# Patient Record
Sex: Female | Born: 1990 | Race: White | Hispanic: Yes | Marital: Single | Smoking: Never smoker
Health system: Southern US, Community
[De-identification: ages and names within clinical notes are randomized; demographics above are authoritative.]

---
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
## 2012-10-12 ENCOUNTER — Emergency Department (HOSPITAL_COMMUNITY): Payer: Self-pay

## 2012-10-12 ENCOUNTER — Encounter (HOSPITAL_COMMUNITY): Payer: Self-pay | Admitting: Emergency Medicine

## 2012-10-12 ENCOUNTER — Emergency Department (HOSPITAL_COMMUNITY)
Admission: EM | Admit: 2012-10-12 | Discharge: 2012-10-12 | Disposition: A | Discharge status: Home / Self Care | Payer: Self-pay | Attending: Emergency Medicine | Admitting: Emergency Medicine

## 2012-10-12 DIAGNOSIS — Z87828 Personal history of other (healed) physical injury and trauma: Secondary | ICD-10-CM | POA: Insufficient documentation

## 2012-10-12 DIAGNOSIS — F1010 Alcohol abuse, uncomplicated: Secondary | ICD-10-CM | POA: Insufficient documentation

## 2012-10-12 DIAGNOSIS — R51 Headache: Secondary | ICD-10-CM | POA: Insufficient documentation

## 2012-10-12 DIAGNOSIS — F329 Major depressive disorder, single episode, unspecified: Secondary | ICD-10-CM | POA: Insufficient documentation

## 2012-10-12 DIAGNOSIS — Z3202 Encounter for pregnancy test, result negative: Secondary | ICD-10-CM | POA: Insufficient documentation

## 2012-10-12 DIAGNOSIS — F17200 Nicotine dependence, unspecified, uncomplicated: Secondary | ICD-10-CM | POA: Insufficient documentation

## 2012-10-12 DIAGNOSIS — F3289 Other specified depressive episodes: Secondary | ICD-10-CM | POA: Insufficient documentation

## 2012-10-12 LAB — COMPREHENSIVE METABOLIC PANEL
ALT: 10 U/L (ref 0–35)
AST: 17 U/L (ref 0–37)
CO2: 29 mEq/L (ref 19–32)
Calcium: 9 mg/dL (ref 8.4–10.5)
GFR calc non Af Amer: >90 mL/min (ref >90)
Sodium: 143 mEq/L (ref 135–145)

## 2012-10-12 LAB — CBC
MCH: 31.9 pg (ref 26.0–34.0)
Platelets: 250 10*3/uL (ref 150–400)
RBC: 4.92 MIL/uL (ref 3.87–5.11)
WBC: 7.5 10*3/uL (ref 4.0–10.5)

## 2012-10-12 LAB — RAPID URINE DRUG SCREEN, HOSP PERFORMED
Amphetamines: NOT DETECTED
Cocaine: NOT DETECTED
Opiates: NOT DETECTED
Tetrahydrocannabinol: POSITIVE — AB

## 2012-10-12 LAB — ACETAMINOPHEN LEVEL: Acetaminophen (Tylenol), Serum: <15 ug/mL (ref 10–30)

## 2012-10-12 MED ORDER — FOLIC ACID 1 MG PO TABS: 1.0000 mg | ORAL_TABLET | Freq: Every day | ORAL | Status: DC

## 2012-10-12 MED ORDER — LORAZEPAM 1 MG PO TABS: 0.0000 mg | ORAL_TABLET | Freq: Four times a day (QID) | ORAL | Status: DC

## 2012-10-12 MED ORDER — VITAMIN B-1 100 MG PO TABS: 100.0000 mg | ORAL_TABLET | Freq: Every day | ORAL | Status: DC

## 2012-10-12 MED ORDER — LORAZEPAM 1 MG PO TABS: 1.0000 mg | ORAL_TABLET | Freq: Three times a day (TID) | ORAL | Status: DC | PRN

## 2012-10-12 MED ORDER — LORAZEPAM 1 MG PO TABS: 0.0000 mg | ORAL_TABLET | Freq: Two times a day (BID) | ORAL | Status: DC

## 2012-10-12 MED ORDER — IBUPROFEN 600 MG PO TABS: 600.0000 mg | ORAL_TABLET | Freq: Three times a day (TID) | ORAL | Status: DC | PRN

## 2012-10-12 MED ORDER — ZOLPIDEM TARTRATE 5 MG PO TABS: 5.0000 mg | ORAL_TABLET | Freq: Every evening | ORAL | Status: DC | PRN

## 2012-10-12 MED ORDER — LORAZEPAM 2 MG/ML IJ SOLN: 1.0000 mg | Freq: Four times a day (QID) | INTRAMUSCULAR | Status: DC | PRN

## 2012-10-12 MED ORDER — ALUM & MAG HYDROXIDE-SIMETH 200-200-20 MG/5ML PO SUSP: 30.0000 mL | ORAL | Status: DC | PRN

## 2012-10-12 MED ORDER — NICOTINE 21 MG/24HR TD PT24: 21.0000 mg | MEDICATED_PATCH | Freq: Every day | TRANSDERMAL | Status: DC

## 2012-10-12 MED ORDER — ONDANSETRON HCL 4 MG PO TABS: 4.0000 mg | ORAL_TABLET | Freq: Three times a day (TID) | ORAL | Status: DC | PRN

## 2012-10-12 MED ORDER — ACETAMINOPHEN 325 MG PO TABS: 650.0000 mg | ORAL_TABLET | ORAL | Status: DC | PRN

## 2012-10-12 MED ORDER — LORAZEPAM 1 MG PO TABS: 1.0000 mg | ORAL_TABLET | Freq: Four times a day (QID) | ORAL | Status: DC | PRN

## 2012-10-12 MED ORDER — ADULT MULTIVITAMIN W/MINERALS CH: 1.0000 | ORAL_TABLET | Freq: Every day | ORAL | Status: DC

## 2012-10-12 MED ORDER — THIAMINE HCL 100 MG/ML IJ SOLN: 100.0000 mg | Freq: Every day | INTRAMUSCULAR | Status: DC

## 2012-10-12 NOTE — ED Notes (Signed)
Patient states she has been drinking alcohol (approximately a fifth a day) x 4 months.  Patient also uses marijuana but denies any other drug use.  Patient states she SI without a plan.  Patient states she wants detox.
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
## 2012-10-12 NOTE — BH Assessment (Signed)
Assessment Note   Holly Myers is an 22 y.o. female. Patient states she has been drinking alcohol (approximately a fifth a day or "6-8 shots) x 4 months daily. Patient last drank alcohol this morning as she was partying with friends.  Patient also uses marijuana 1x per weekend but denies any other drug use. She last smoked marijuana 10/10/12. Per ED notes, patient wants detox. However, during the [facility] assessment patient sts that she does not want detox and would like to go home asap. Patient does not appear motivated for any type of treatment suggested, not even outpatient. Sts she has never received substance abuse treatment in the past. Patient denies active withdrawal symptoms at this time. No history of seizures or blackouts.  Per ED notes, patient states she is SI without a plan. During the [facility] assessment patient denies SI stating, "I don't remember saying that I must have been drunk". Patient adamantly denies SI at this time. Also, contracts for safety today. She admits to 1 prior suicide attempt as a child stating she was trying to deal with being gay. Patient does admit to current depression. She denies any triggers for her depression or current stressors. Says that she sometimes isolates or self from others and feels guilty. Patient does not feel that she has a lot of support at home (lives with both parents).  No HI or AVH's.   Patient has no history of mental health hospitalizations or outpatient therapy.    Axis I: Substance Induced Mood Disorder, Alcohol Dependence Axis II: Deferred Axis III: History reviewed. No pertinent past medical history. Axis IV: other psychosocial or environmental problems, problems related to social environment, problems with access to health care services and problems with primary support group Axis V: 31-40 impairment in reality testing  Past Medical History: History reviewed. No pertinent past medical history.  History reviewed. No pertinent past surgical  history.  Family History: History reviewed. No pertinent family history.  Social History:  reports that she has been smoking Cigarettes.  She has been smoking about 2.00 packs per day. She does not have any smokeless tobacco history on file. She reports that  drinks alcohol. She reports that she uses illicit drugs (Marijuana).  Additional Social History:  Alcohol / Drug Use Pain Medications: SEE MAR Prescriptions: SEE MAR Over the Counter: SEE MAR History of alcohol / drug use?: Yes Longest period of sobriety (when/how long): "a few days" Substance #1 Name of Substance 1: Alcohol  1 - Age of First Use: 22 yrs old  1 - Amount (size/oz): 1 pint or "several shots 7 or 8" 1 - Frequency: daily for the past 4 months  1 - Duration: 4 months  1 - Last Use / Amount: 10/11/12 Substance #2 Name of Substance 2: THC 2 - Age of First Use: 22 yrs old  2 - Amount (size/oz): varies 2 - Frequency: 1x per weekend 2 - Duration: since age 10  2 - Last Use / Amount: 10/10/12  CIWA: CIWA-Ar BP: 121/86 mmHg Pulse Rate: 85 Nausea and Vomiting: no nausea and no vomiting Tactile Disturbances: none Tremor: no tremor Auditory Disturbances: not present Paroxysmal Sweats: no sweat visible Visual Disturbances: not present Anxiety: no anxiety, at ease Headache, Fullness in Head: none present Agitation: normal activity Orientation and Clouding of Sensorium: oriented and can do serial additions CIWA-Ar Total: 0 COWS:    Allergies:  Allergies  Allergen Reactions  . Pollen Extract Shortness Of Breath    Home Medications:  (Not in  a hospital admission)  OB/GYN Status:  Patient's last menstrual period was 09/14/2012.  General Assessment Data Location of Assessment: [facility] ED Living Arrangements: Parent Can pt return to current living arrangement?: Yes Admission Status: Voluntary Is patient capable of signing voluntary admission?: Yes Transfer from: Acute Hospital Referral Source:  Self/Family/Friend     Risk to self Suicidal Ideation: Yes-Currently Present (per ED staff patient made suicidal comment; pt denies) Suicidal Intent: No Is patient at risk for suicide?: No Suicidal Plan?: No Access to Means: No What has been your use of drugs/alcohol within the last 12 months?:  (patient reports daily alcohol use and weekend THC use) Previous Attempts/Gestures: Yes How many times?:  (1x) Other Self Harm Risks:  (n/a) Triggers for Past Attempts: Other (Comment) ("Trying to deal with being gay") Intentional Self Injurious Behavior: None Family Suicide History: No Recent stressful life event(s): Other (Comment) (patient denies any stressors at this time) Persecutory voices/beliefs?: No Depression: No Substance abuse history and/or treatment for substance abuse?: No Suicide prevention information given to non-admitted patients: Not applicable  Risk to Others Homicidal Ideation: No Thoughts of Harm to Others: No Current Homicidal Intent: No Current Homicidal Plan: No Access to Homicidal Means: No Identified Victim:  (n/a) History of harm to others?: No Assessment of Violence: None Noted Violent Behavior Description:  (patient calm and cooperative) Does patient have access to weapons?: No Criminal Charges Pending?: No Does patient have a court date: No  Psychosis Hallucinations: None noted Delusions: None noted  Mental Status Report Appear/Hygiene: Disheveled Eye Contact: Good Motor Activity: Freedom of movement Speech: Logical/coherent Level of Consciousness: Alert Mood: Depressed Affect: Appropriate to circumstance Anxiety Level: None Thought Processes: Coherent Judgement: Unimpaired Orientation: Person;Place;Situation;Time Obsessive Compulsive Thoughts/Behaviors: None  Cognitive Functioning Concentration: Decreased Memory: Recent Intact;Remote Intact IQ: Average Insight: Fair Impulse Control: Fair Appetite: Good Weight Loss:  (none  reported) Weight Gain:  (none reported) Sleep: Decreased Total Hours of Sleep:  (n/a) Vegetative Symptoms: None  ADLScreening [facility] Assessment Services) Patient's cognitive ability adequate to safely complete daily activities?: Yes Patient able to express need for assistance with ADLs?: Yes Independently performs ADLs?: Yes (appropriate for developmental age)  Abuse/Neglect [facility]) Physical Abuse: Denies Verbal Abuse: Denies Sexual Abuse: Denies  Prior Inpatient Therapy Prior Inpatient Therapy: No Prior Therapy Dates:  (n/a) Prior Therapy Facilty/Provider(s):  (n/a) Reason for Treatment:  (n/a)  Prior Outpatient Therapy Prior Outpatient Therapy: No Prior Therapy Dates:  (n/a) Prior Therapy Facilty/Provider(s):  (n/a) Reason for Treatment:  (n/a)  ADL Screening (condition at time of admission) Patient's cognitive ability adequate to safely complete daily activities?: Yes Patient able to express need for assistance with ADLs?: Yes Independently performs ADLs?: Yes (appropriate for developmental age) Weakness of Legs: None Weakness of Arms/Hands: None  Home Assistive Devices/Equipment Home Assistive Devices/Equipment: None    Abuse/Neglect Assessment (Assessment to be complete while patient is alone) Physical Abuse: Denies Verbal Abuse: Denies Sexual Abuse: Denies Exploitation of patient/patient's resources: Denies Self-Neglect: Denies Values / Beliefs Cultural Requests During Hospitalization: None Spiritual Requests During Hospitalization: None     Nutrition Screen- [facility] Patient's home diet: Regular  Additional Information 1:1 In Past 12 Months?: No CIRT Risk: No Elopement Risk: No Does patient have medical clearance?: Yes     Disposition:  Disposition Initial Assessment Completed for this Encounter: Yes Disposition of Patient: Inpatient treatment program Type of inpatient treatment program: Adult  On Site Evaluation by:   Reviewed with  Physician:     Octaviano Batty 10/12/2012  1:07 PM
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
## 2012-10-12 NOTE — BHH Counselor (Signed)
Discharge home per telepsych. Writer met with patient to discuss follow up options. Patient expressed again that she is not interested in treatment at this time and sts she can quit on her own. She agreed to take the list of referrals provided and also agreed to follow up when she is ready.

## 2014-08-03 IMAGING — CT CT HEAD W/O CM
2 series · 16 of 30 positions shown, 20 images · non-contrast
Comparison: None.

CLINICAL DATA: Acute onset left sided headache

CT HEAD WITHOUT CONTRAST
TECHNIQUE: Contiguous axial images were obtained from the base of
the skull through the vertex without contrast.

[Series 2: head w/o · axial · non-contrast · 0.43mm/px · z∈[-140,-20]mm · 13 of 29 slices shown, 17 images]
[im 3/29  brain]
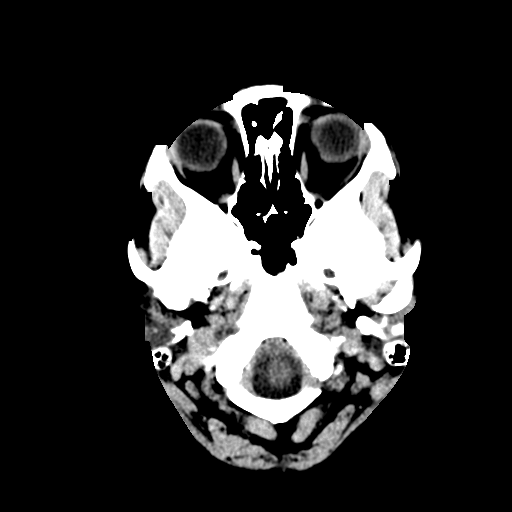
[im 3/29  bone]
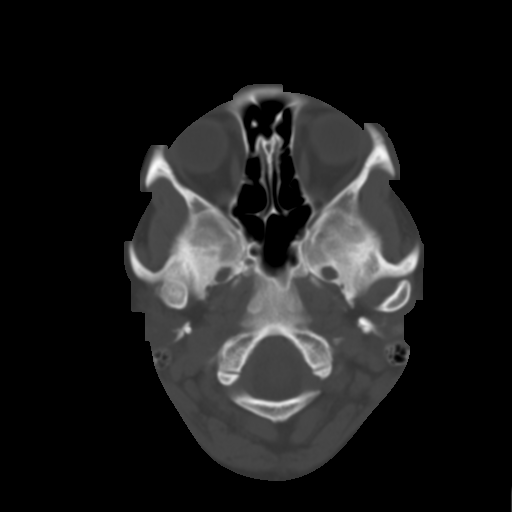
[im 5/29  brain]
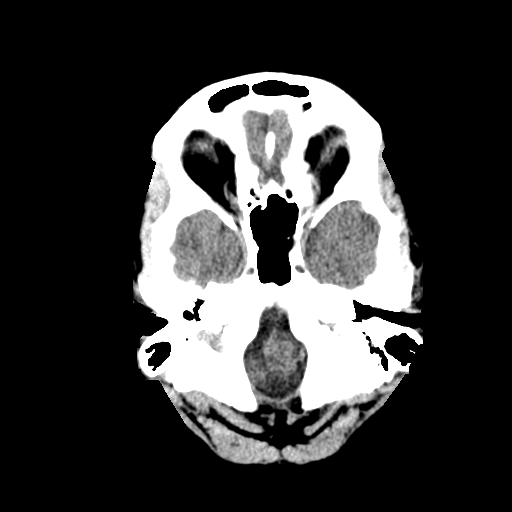
[im 7/29  brain]
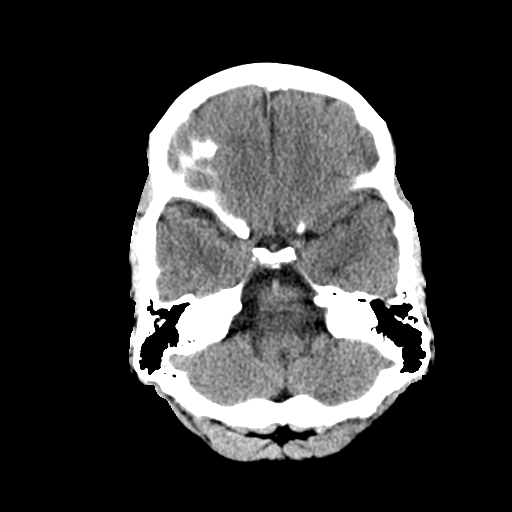
[im 9/29  brain]
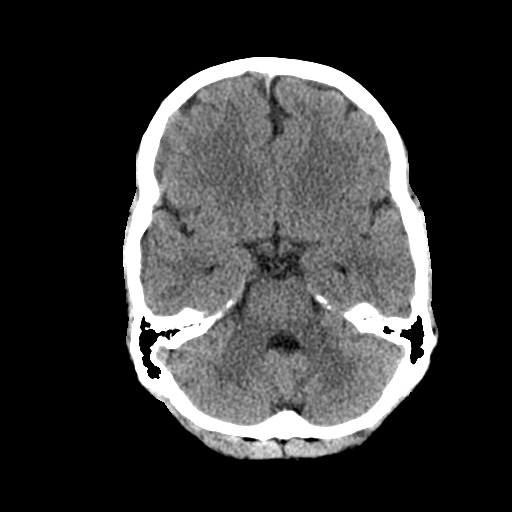
[im 11/29  brain]
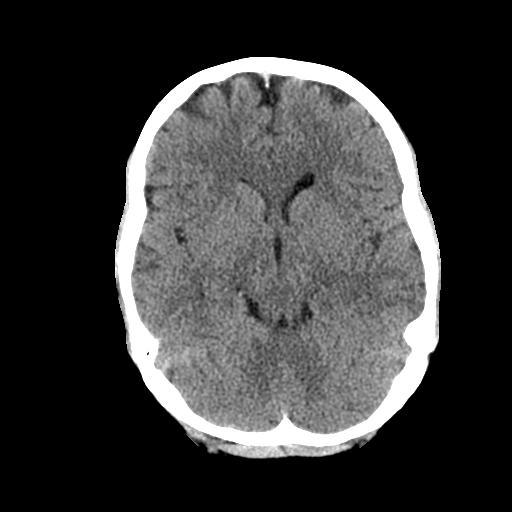
[im 11/29  bone]
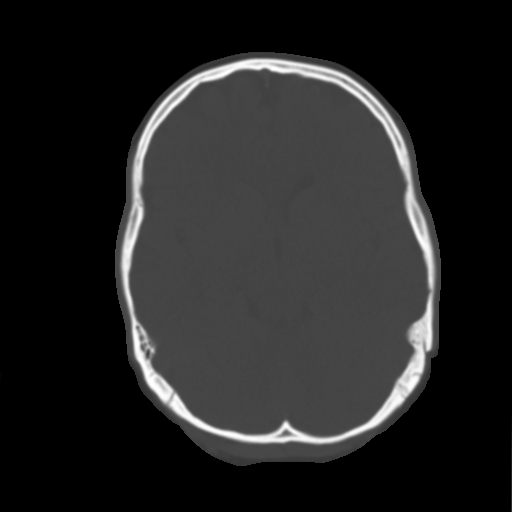
[im 13/29  brain]
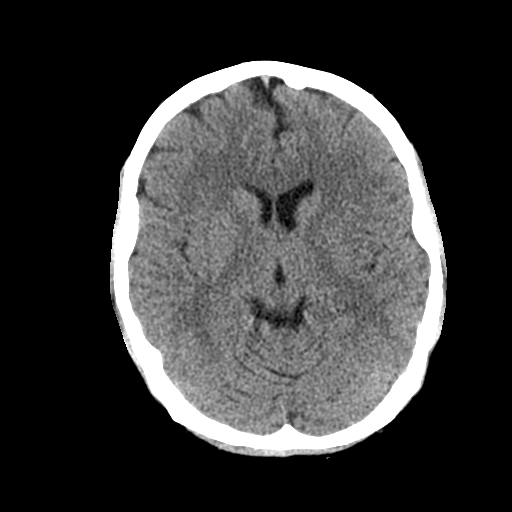
[im 15/29  brain]
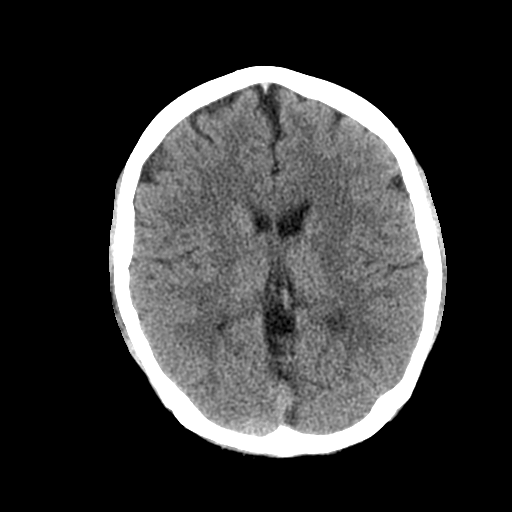
[im 17/29  brain]
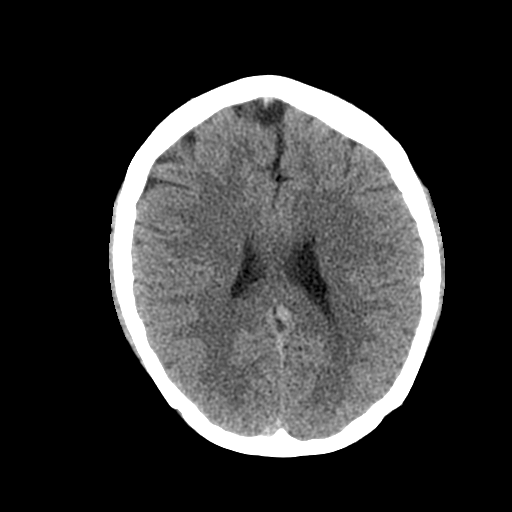
[im 19/29  brain]
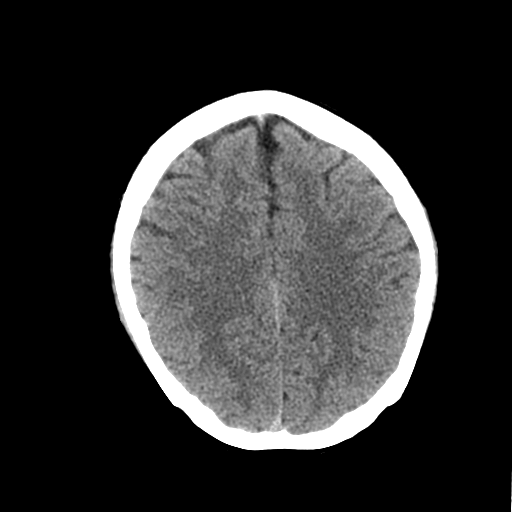
[im 19/29  bone]
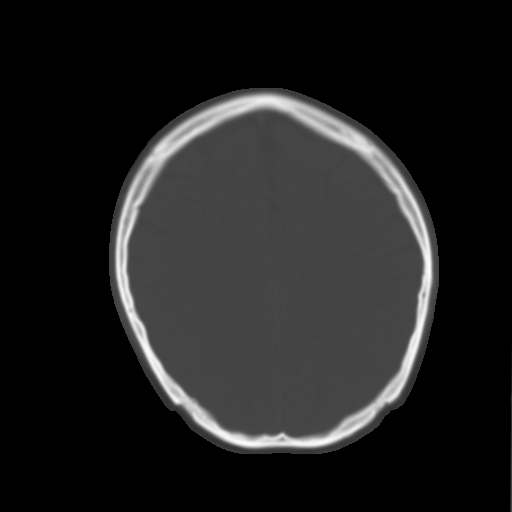
[im 21/29  brain]
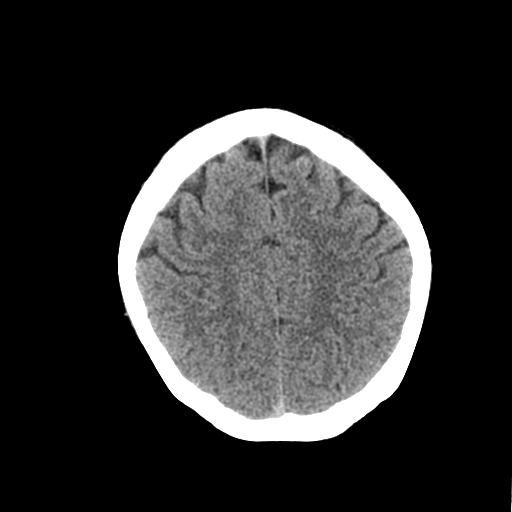
[im 23/29  brain]
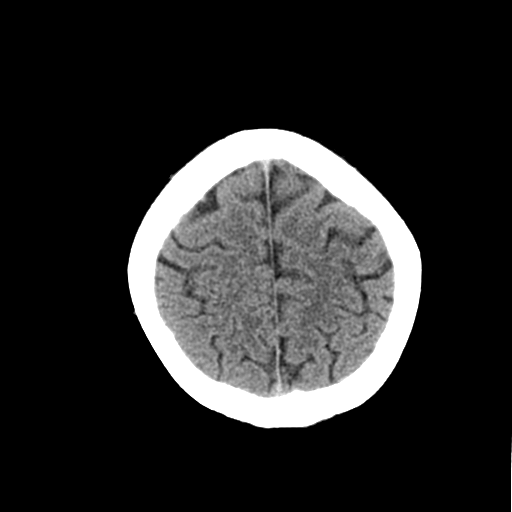
[im 25/29  brain]
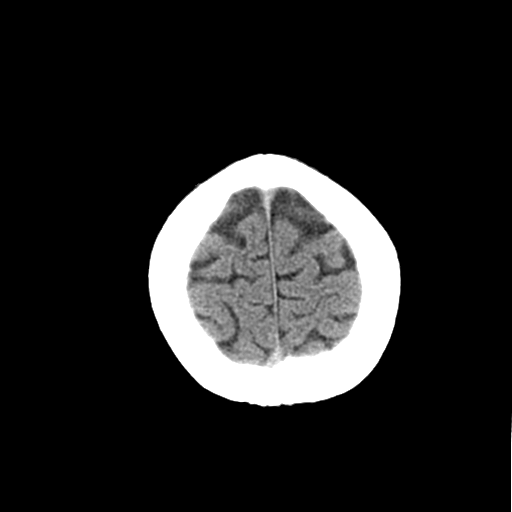
[im 27/29  brain]
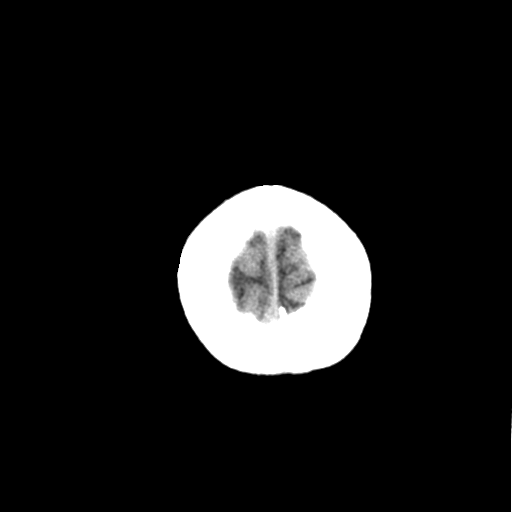
[im 27/29  bone]
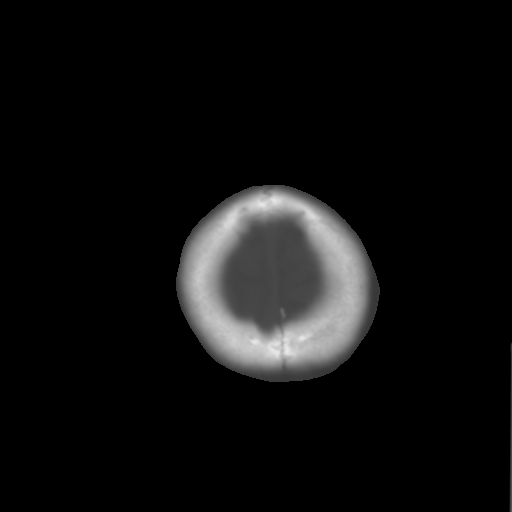

[Series 3: bone windows · axial · 0.43mm/px · z∈[-140,-100]mm · 3 of 29 slices shown]
[im 3/29  bone]
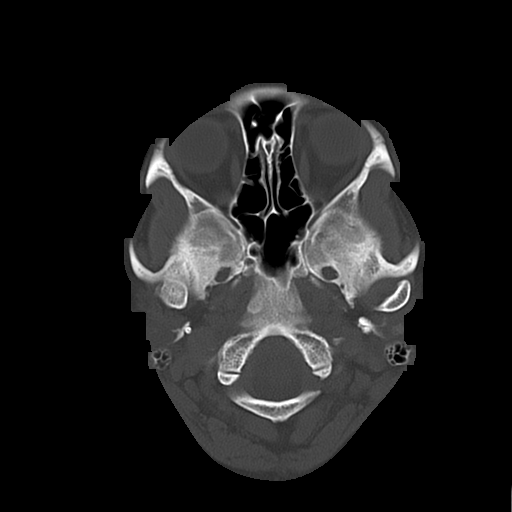
[im 7/29  bone]
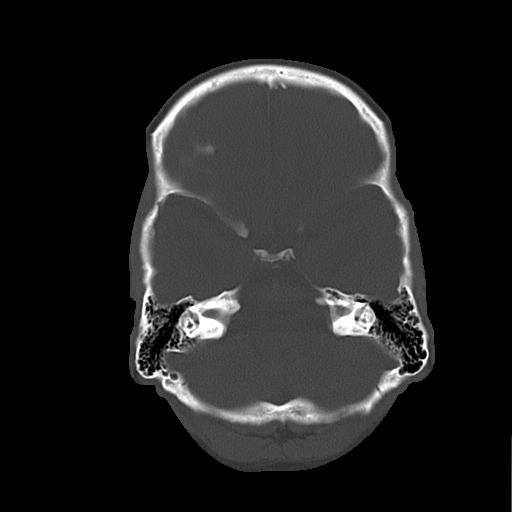
[im 11/29  bone]
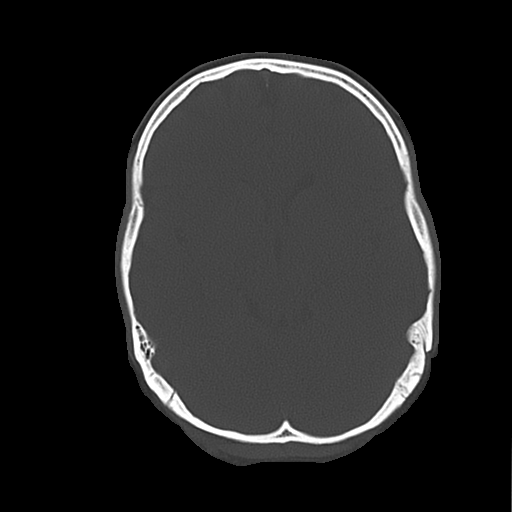

[16 of 30 positions shown; findings below may reference images not displayed]

FINDINGS: Ventricles are normal in size and configuration.  Left
lateral ventricle is slightly larger than right lateral ventricle,
an anatomic variant.  There is no mass, hemorrhage, extra-axial
fluid collection, or midline shift.  Gray-white compartments are
normal.  There is no apparent acute infarct.

The bony calvarium appears intact.  The mastoid air cells are
clear.
IMPRESSION: Study within normal limits.

## 2014-12-11 ENCOUNTER — Emergency Department (HOSPITAL_BASED_OUTPATIENT_CLINIC_OR_DEPARTMENT_OTHER)
Admission: EM | Admit: 2014-12-11 | Discharge: 2014-12-11 | Disposition: A | Discharge status: Home / Self Care | Payer: Self-pay | Attending: Emergency Medicine | Admitting: Emergency Medicine

## 2014-12-11 ENCOUNTER — Encounter (HOSPITAL_BASED_OUTPATIENT_CLINIC_OR_DEPARTMENT_OTHER): Payer: Self-pay

## 2014-12-11 ENCOUNTER — Emergency Department (HOSPITAL_BASED_OUTPATIENT_CLINIC_OR_DEPARTMENT_OTHER): Payer: Self-pay

## 2014-12-11 DIAGNOSIS — Y9389 Activity, other specified: Secondary | ICD-10-CM | POA: Insufficient documentation

## 2014-12-11 DIAGNOSIS — Y92410 Unspecified street and highway as the place of occurrence of the external cause: Secondary | ICD-10-CM | POA: Insufficient documentation

## 2014-12-11 DIAGNOSIS — Z3202 Encounter for pregnancy test, result negative: Secondary | ICD-10-CM | POA: Insufficient documentation

## 2014-12-11 DIAGNOSIS — Y998 Other external cause status: Secondary | ICD-10-CM | POA: Insufficient documentation

## 2014-12-11 DIAGNOSIS — R079 Chest pain, unspecified: Secondary | ICD-10-CM

## 2014-12-11 DIAGNOSIS — S20219A Contusion of unspecified front wall of thorax, initial encounter: Secondary | ICD-10-CM | POA: Insufficient documentation

## 2014-12-11 LAB — PREGNANCY, URINE: Preg Test, Ur: NEGATIVE

## 2014-12-11 MED ORDER — ACETAMINOPHEN 500 MG PO TABS
1000.0000 mg | ORAL_TABLET | Freq: Once | ORAL | Status: AC
  Administered 2014-12-11: 1000 mg via ORAL
  Filled 2014-12-11: qty 2

## 2014-12-11 MED ORDER — HYDROCODONE-ACETAMINOPHEN 5-325 MG PO TABS: ORAL_TABLET | ORAL | Status: AC
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
## 2014-12-11 NOTE — Discharge Instructions (Signed)
Take vicodin for breakthrough pain, do not drink alcohol, drive, care for children or do other critical tasks while taking vicodin.  It is very important that you take deep breaths to prevent lung collapse and infection.  Take 10 deep breaths every hour to prevent lung collapse.  If you develop cough, fever or shortness of breath return immediately to the emergency room.  Do not hesitate to return to the emergency room for any new, worsening or concerning symptoms.  Please obtain primary care using resource guide below. Let them know that you were seen in the emergency room and that they will need to obtain records for further outpatient management.   Chest Wall Pain Chest wall pain is pain felt in or around the chest bones and muscles. It may take up to 6 weeks to get better. It may take longer if you are active. Chest wall pain can happen on its own. Other times, things like germs, injury, coughing, or exercise can cause the pain. HOME CARE   Avoid activities that make you tired or cause pain. Try not to use your chest, belly (abdominal), or side muscles. Do not use heavy weights.  Put ice on the sore area.  Put ice in a plastic bag.  Place a towel between your skin and the bag.  Leave the ice on for 15-20 minutes for the first 2 days.  Only take medicine as told by your doctor. GET HELP RIGHT AWAY IF:   You have more pain or are very uncomfortable.  You have a fever.  Your chest pain gets worse.  You have new problems.  You feel sick to your stomach (nauseous) or throw up (vomit).  You start to sweat or feel lightheaded.  You have a cough with mucus (phlegm).  You cough up blood. MAKE SURE YOU:   Understand these instructions.  Will watch your condition.  Will get help right away if you are not doing well or get worse. Document Released: 11/20/2007 Document Revised: 08/26/2011 Document Reviewed: 01/28/2011 [facility] Patient Information 2015 [place], [place]. This  information is not intended to replace advice given to you by your health care provider. Make sure you discuss any questions you have with your health care provider.   Emergency Department Resource Guide 1) Find a Doctor and Pay Out of Pocket Although you won't have to find out who is covered by your insurance plan, it is a good idea to ask around and get recommendations. You will then need to call the office and see if the doctor you have chosen will accept you as a new patient and what types of options they offer for patients who are self-pay. Some doctors offer discounts or will set up payment plans for their patients who do not have insurance, but you will need to ask so you aren't surprised when you get to your appointment.  2) Contact Your Local Health Department Not all health departments have doctors that can see patients for sick visits, but many do, so it is worth a call to see if yours does. If you don't know where your local health department is, you can check in your phone book. The CDC also has a tool to help you locate your state's health department, and many state websites also have listings of all of their local health departments.  3) Find a Walk-in Clinic If your illness is not likely to be very severe or complicated, you may want to try a walk in clinic. These are  popping up all over the country in pharmacies, drugstores, and shopping centers. They're usually staffed by nurse practitioners or physician assistants that have been trained to treat common illnesses and complaints. They're usually fairly quick and inexpensive. However, if you have serious medical issues or chronic medical problems, these are probably not your best option.  No Primary Care Doctor: - Call Health Connect at  [phone] - they can help you locate a primary care doctor that  accepts your insurance, provides certain services, etc. - Physician Referral Service- [phone]  Chronic Pain  Problems: Organization         Address  Phone   Notes  [facility]  [phone] Patients need to be referred by their primary care doctor.   Medication Assistance: Organization         Address  Phone   Notes  [facility] Medication [facility] [address] [place] [place], [place] [address] [phone] --Must be a resident of [facility] -- Must have NO insurance coverage whatsoever (no Medicaid/ Medicare, etc.) -- The pt. MUST have a primary care doctor that directs their care regularly and follows them in the community   MedAssist  [phone]   [employer]  [phone]    Agencies that provide inexpensive medical care: Organization         Address  Phone   Notes  [facility]  [phone]   [facility]    [phone]   [facility] [address] [place], [place] [address] [phone]   [facility] [address] [place] [place] [phone]   Planned Parenthood    [phone]   [facility]    [phone]   [facility] and [facility]  [address], [place] Phone:  [phone], Fax:  [phone] Hours of Operation:  9 am - 6 pm, M-F.  Also accepts Medicaid/Medicare and self-pay.  [facility]  [address], [place] Phone: [phone], Fax: [phone]. Hours of Operation:  8:30 am - 5:30 pm, M-F.  Also accepts Medicaid and self-pay.  [facility] [place] [address] [place] Phone: [phone]   [facility] [address] Natasha Bence [place], [place] [phone], Ext. [phone] Mondays & Thursdays: 7-9 AM.  First 15 patients are seen on a first come, first serve basis.    Medicaid-accepting [facility] Providers:  Organization         Address  Phone   Notes  [facility] [address], [place] [phone] Also  accepts self-pay patients.  [facility] [address] Laurell Josephs [employer], [place]  [phone]   [facility] [address], [place] [phone]   [facility] [address] [place] [phone]   Renaye Rakers [address], [place]   [phone] Only accepts [place] Access [place] patients after they have their name applied to their card.   Self-Pay (no insurance) in [facility]:  Organization         Address  Phone   Notes  Sickle Cell Patients, [facility] [address] [place], [place] [phone]   [facility] [address] [place], [place] [phone]   [facility] [place]  [place], [place] [phone]   [facility]/Dr. Osei-Bonsu  [address], [place] or [address], [place] [phone] Phone number for both [place] and [place] locations is the same.  [facility] [address], [place] [phone]   [facility] [address] [place] or [address] [phone]   [facility] [address] [place] [phone], phone; [phone], fax Sees patients 1st and 3rd Saturday of every month.  Must not qualify for public or private insurance (i.e. Medicaid, Medicare, [place] Health Choice, Veterans' Benefits)  Household income should be no more than 200% of the poverty level The clinic cannot treat you if you are pregnant or think you are pregnant  Sexually transmitted diseases are not treated at the clinic.    Dental Care: Organization         Address  Phone  Notes  [facility] Department of [facility] [place] [phone] Accepts children up to age 73 who are enrolled in [place] or [place]; pregnant  women with a Medicaid card; and children who have applied for Medicaid or [place] Health Choice, but were declined, whose parents can pay a reduced fee at time of service.  [facility] Department of [facility]  [address], [place] [phone] Accepts children up to age 60 who are enrolled in [place] or [place]; pregnant women with a Medicaid card; and children who have applied for Medicaid or  Health Choice, but were declined, whose parents can pay a reduced fee at time of service.  [place] Adult [facility] Access PROGRAM  [place] [phone] Patients are seen by appointment only. Walk-ins are not accepted. [facility] will see patients 50 years of age and older. Monday - Tuesday (8am-5pm) Most Wednesdays (8:30-5pm) $30 per visit, cash only  [facility] Adult [facility] Access PROGRAM  [address], [facility] [phone] Patients are seen by appointment only. Walk-ins are not accepted. [place] will see patients 40 years of age and older. One Wednesday Evening (Monthly: Volunteer Based).  $30 per visit, cash only  [place]  [phone] for adults; Children under age 29, call Graduate Pediatric Dentistry at [phone]. Children aged 15-14, please call [phone] to request a pediatric application.  Dental services are provided in all areas of dental care including fillings, crowns and bridges, complete and partial dentures, implants, gum treatment, root canals, and extractions. Preventive care is also provided. Treatment is provided to both adults and children. Patients are selected via a lottery and there is often a waiting list.   [facility] [address] [place]  [phone] [email or web address]   [facility] [address] [place], [place] [phone], Ext. [phone] Second and Fourth Thursday of each month, opens at 6:30 AM; Clinic ends at 9 AM.  Patients are  seen on a first-come first-served basis, and a limited number are seen during each clinic.   [facility]  [address] Hillard Danker [place], [place] [phone]   Eligibility Requirements You must have lived in [place], [place], or [place] counties for at least the last three months.   You cannot be eligible for state or federal sponsored [employer], including [employer], [place],  or Medicare.   You generally cannot be eligible for healthcare insurance through your employer.    How to apply: Eligibility screenings are held every Tuesday and Wednesday afternoon from 1:00 pm until 4:00 pm. You do not need an appointment for the interview!  [facility] [address] [place], [place]   [place]  [place] Department  [place]  [phone]    Behavioral Health Resources in the Community: Intensive Outpatient Programs Organization         Address  Phone  Notes  Marion Morton. [address] [place], [place] [phone]   [facility] Outpatient [address], [place], [place]   ADS: Alcohol [facility] Drug [facility] [address] [place], [place]   [place] [address]  [place], [place] or [phone]   Substance Abuse Resources Organization         Address  Phone  Notes  Alcohol and Drug Services  [phone]   [place]  [phone]   [facility] [place]   Chinita Pester  [phone]   Residential & Outpatient Substance Abuse Program  [phone]   Psychological Services Organization         Address  Phone  Notes  [facility] [place]  [place]  [phone]   [place] [address], [place] or [phone]    Mobile Crisis  Teams Organization         Address  Phone  Notes  [facility], Mobile Crisis Care Unit  [phone]   [facility]otherapeutic [facility]  [address] [place], [place]   [place] Levels [address] [place] [phone]    Self-Help/Support Groups Organization         Address  Phone             Notes  [place]. of [place] - variety of support groups  [place] Call for more information  Narcotics Anonymous (NA), [facility] [address], [employer] [place]  2 meetings at this location   Special educational needs teacher         Address  Phone  Notes  [facility] Residential Treatment [place],    [place]  [phone]   [facility]  [address] [place] [phone], [place], [place]   [place], [place] [phone] Admissions: 8am-3pm M-F  Incentives Substance [place] [address]    [place], [place] [phone]   [facility] [address] [place], [place], [place]   [facility] [address],  [place], [place]   [facility]ight Programs - Intensive Outpatient [address] Kristeen [address], [place], [place]   [facility] [address].,  [place], [place] [phone] or [phone]   [facility] (RTS) [address], [place], [place] Accepts Medicaid  [facility] [place] [address]  [place] [phone] Substance Abuse/Addiction Treatment   [facility] Organization         Address  Phone  Notes  [facility]  [phone]   Domenic Schwab, PhD [address] Muse [place], [place]   [phone] or [phone]   Zacarias Pontes  Behavioral   [address] [place], [place] [phone] Recovery [address] [place], [place] [phone]  Insurance/Medicaid/sponsorship through [facility] and Families [address]                                    [place], [place] [phone] Therapy/tele-psych/case  [facility] [address]   [place], [place] [phone]    Dr. Lolly Mustache  [phone]   [facility] [place]  [facility] Dept. 1) [address], [place] 2) [address], [place] 3)  [address], [place] [phone]  [phone]   [facility] Child Abuse Hotline 3808440626 or [phone] (After Hours)
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
## 2014-12-22 ENCOUNTER — Encounter (HOSPITAL_BASED_OUTPATIENT_CLINIC_OR_DEPARTMENT_OTHER): Payer: Self-pay
